# Patient Record
Sex: Female | Born: 2000 | Race: Black or African American | Hispanic: No | Marital: Single | Smoking: Never smoker
Health system: Southern US, Community
[De-identification: ages and names within clinical notes are randomized; demographics above are authoritative.]

---
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
## 2020-01-01 ENCOUNTER — Encounter: Payer: Self-pay | Admitting: Physician Assistant

## 2020-01-01 ENCOUNTER — Other Ambulatory Visit: Payer: Self-pay

## 2020-01-01 ENCOUNTER — Ambulatory Visit: Payer: Medicaid Other | Admitting: Family Medicine

## 2020-01-01 ENCOUNTER — Ambulatory Visit: Payer: Medicaid Other

## 2020-01-01 DIAGNOSIS — Z113 Encounter for screening for infections with a predominantly sexual mode of transmission: Secondary | ICD-10-CM | POA: Diagnosis not present

## 2020-01-01 LAB — WET PREP FOR TRICH, YEAST, CLUE
Trichomonas Exam: NEGATIVE
Yeast Exam: NEGATIVE

## 2020-01-01 NOTE — Progress Notes (Signed)
Patient here for STD testing.Kerry Faxon Brewer-Jensen, RN 

## 2020-01-01 NOTE — Progress Notes (Signed)
[facility] Department STI clinic/screening visit  Subjective:  Kerry Torres is a 19 y.o. female being seen today for an STI screening visit. The patient reports they do not have symptoms.  Patient reports that they do not desire a pregnancy in the next year.   They reported they are not interested in discussing contraception today.  Patient's last menstrual period was 12/14/2019 (approximate).   Patient has the following medical conditions:  There are no problems to display for this patient.   Chief Complaint  Patient presents with  . Exposure to STD    screening     HPI  Patient reports just her for a check up, denies any signs and symptoms.  Reports that Acyclovir  is working well for her with HSV out breaks last out break was 09/2019.   Last HIV test per patient/review of record was 12/2018 No previous PAP d/t patient age.    See flowsheet for further details and programmatic requirements.    The following portions of the patient's history were reviewed and updated as appropriate: allergies, current medications, past medical history, past social history, past surgical history and problem list.  Objective:  There were no vitals filed for this visit.  Physical Exam Constitutional:      Appearance: Normal appearance.  HENT:     Head: Normocephalic.     Comments: In scalp, brows and lashes: no nits, no hair loss    Mouth/Throat:     Mouth: Mucous membranes are moist.     Pharynx: Oropharynx is clear. No oropharyngeal exudate or posterior oropharyngeal erythema.  Abdominal:     General: Abdomen is flat.     Palpations: Abdomen is soft. There is no mass.     Tenderness: There is no abdominal tenderness. There is no guarding or rebound.  Genitourinary:    Comments: External genitalia without, lice, nits, erythema, edema , lesions or inguinal adenopathy. Vagina with normal mucosa and discharge and pH equals 4.  Cervix without visual lesions, uterus firm,  mobile, non-tender, no masses, CMT adnexal fullness or tenderness.  Musculoskeletal:     Cervical back: Normal range of motion and neck supple.  Lymphadenopathy:     Cervical: No cervical adenopathy.  Skin:    General: Skin is warm and dry.     Findings: No bruising, erythema, lesion or rash.  Neurological:     Mental Status: She is alert.  Psychiatric:        Mood and Affect: Mood normal.        Behavior: Behavior normal.      Assessment and Plan:  Kerry Torres is a 19 y.o. female presenting to the [facility] Department for STI screening  1. Screening examination for venereal disease  - Chlamydia/Gonorrhea [place] [facility] - Syphilis Serology, [place] [facility] - HIV  LAB - WET PREP FOR TRICH, YEAST, CLUE  Patient accepted all screenings including  vaginal CT/GC, wet prep and bloodwork for HIV/RPR.  Patient meets criteria for HepB screening? No. Ordered? No - does not meet critera  Patient meets criteria for HepC screening? No. Ordered? No - does not meet critera   Per wet prep no treatment need.  Patient denies s/sx  Discussed time line for [facility] results and that patient will be called with positive results and encouraged patient to call if she had not heard in 2 weeks.  Counseled to return or seek care for continued or worsening symptoms Recommended condom use with  all sex  Patient is currently using condoms to prevent pregnancy.  Discussed with patient about planning pregnancies.  Patient does not want any BCM d/t family and friends negative experiences with BC.  BC information to be given.    Discussed with patient about MVI w/ folic and immunizations.     No follow-ups on file.  No future appointments.  Patient ot call if any questions or concerns.     Wendi Snipes, FNP

## 2020-01-02 LAB — HM HIV SCREENING LAB: HM HIV Screening: NEGATIVE

## 2020-03-31 ENCOUNTER — Telehealth: Payer: Self-pay | Admitting: Family Medicine

## 2020-03-31 NOTE — Telephone Encounter (Signed)
Call from patient. Needs acyclovir refill, last is was 01/2019. Patient scheduled for IS on 04/05/2020.   Harvie Heck, RN

## 2020-03-31 NOTE — Telephone Encounter (Signed)
Patient would like to speak with the provider that wrote her the prescription on her last visit.

## 2020-03-31 NOTE — Telephone Encounter (Signed)
Call to patient, no answer, unable to leave message.   Harvie Heck, RN

## 2020-04-05 ENCOUNTER — Ambulatory Visit: Payer: Medicaid Other | Admitting: Physician Assistant

## 2020-04-05 ENCOUNTER — Encounter: Payer: Self-pay | Admitting: Physician Assistant

## 2020-04-05 ENCOUNTER — Other Ambulatory Visit: Payer: Self-pay

## 2020-04-05 DIAGNOSIS — B009 Herpesviral infection, unspecified: Secondary | ICD-10-CM | POA: Insufficient documentation

## 2020-04-05 DIAGNOSIS — Z113 Encounter for screening for infections with a predominantly sexual mode of transmission: Secondary | ICD-10-CM | POA: Diagnosis not present

## 2020-04-05 MED ORDER — ACYCLOVIR 800 MG PO TABS: 800.0000 mg | ORAL_TABLET | Freq: Every day | ORAL | 11 refills | Status: AC

## 2020-04-05 NOTE — Progress Notes (Signed)
Patient here for rx refill on acyclovir. Declined testing.   Harvie Heck, RN

## 2020-04-05 NOTE — Progress Notes (Signed)
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  States that she had been using the treatment episodically, but would like to change to taking the medicine daily.  Denies changes to personal and surgical history since last visit.  Denies any new allergies and current medications. O:  WDWN female in NAD, A&O x 3, normal work of breathing. A/P:  1.  Patient with history of HSV-2. 2.  Patient declines screening for any infection today.  3.  Per patient request, Acyclovir 800 mg #30 1 po daily with refills for 1 year sent electronically to patient's pharmacy of choice. 4.  Enc condoms with all sex. 5.  RTC prn.

## 2020-06-30 ENCOUNTER — Other Ambulatory Visit: Payer: Self-pay | Admitting: Physician Assistant

## 2021-05-05 ENCOUNTER — Other Ambulatory Visit: Payer: Self-pay | Admitting: Physician Assistant

## 2021-05-05 DIAGNOSIS — B009 Herpesviral infection, unspecified: Secondary | ICD-10-CM

## 2021-05-09 NOTE — Telephone Encounter (Signed)
I need my meds called in to the pharmacy

## 2021-05-10 ENCOUNTER — Other Ambulatory Visit: Payer: Self-pay | Admitting: Family Medicine

## 2021-05-10 DIAGNOSIS — B009 Herpesviral infection, unspecified: Secondary | ICD-10-CM

## 2021-05-10 MED ORDER — ACYCLOVIR 800 MG PO TABS: 800.0000 mg | ORAL_TABLET | Freq: Two times a day (BID) | ORAL | 0 refills | Status: AC

## 2021-05-10 NOTE — Telephone Encounter (Signed)
Phone call with pt regarding refill request. Rx is being sent by provider for 1x episodic tx.  Pt scheduled for 05/17/21 STD appt and provider will address 1 year of refills of suppressive therapy at that visit.

## 2021-05-10 NOTE — Progress Notes (Signed)
Pt called and needs refill on medication for HSV,  she is currently having a outbreak.  Ordered a 1 time episodic treatment for  acyclovir  and patient to be scheduled for annual appointment with provider.    1. HSV-2 infection  - acyclovir (ZOVIRAX) 800 MG tablet; Take 1 tablet (800 mg total) by mouth 2 (two) times daily for 5 days.  Dispense: 10 tablet; Refill: 0    Wendi Snipes, FNP

## 2021-05-17 ENCOUNTER — Ambulatory Visit: Payer: Medicaid Other

## 2021-05-20 ENCOUNTER — Ambulatory Visit: Payer: Medicaid Other

## 2021-05-22 ENCOUNTER — Encounter (HOSPITAL_COMMUNITY): Payer: Self-pay

## 2021-05-22 ENCOUNTER — Emergency Department (HOSPITAL_COMMUNITY): Payer: Medicaid Other

## 2021-05-22 ENCOUNTER — Emergency Department (HOSPITAL_COMMUNITY)
Admission: EM | Admit: 2021-05-22 | Discharge: 2021-05-22 | Disposition: A | Discharge status: Home / Self Care | Payer: Medicaid Other | Attending: Emergency Medicine | Admitting: Emergency Medicine

## 2021-05-22 DIAGNOSIS — T1490XA Injury, unspecified, initial encounter: Secondary | ICD-10-CM

## 2021-05-22 DIAGNOSIS — W3400XA Accidental discharge from unspecified firearms or gun, initial encounter: Secondary | ICD-10-CM | POA: Insufficient documentation

## 2021-05-22 DIAGNOSIS — Z23 Encounter for immunization: Secondary | ICD-10-CM | POA: Diagnosis not present

## 2021-05-22 DIAGNOSIS — S79921A Unspecified injury of right thigh, initial encounter: Secondary | ICD-10-CM | POA: Diagnosis present

## 2021-05-22 DIAGNOSIS — Y92410 Unspecified street and highway as the place of occurrence of the external cause: Secondary | ICD-10-CM | POA: Insufficient documentation

## 2021-05-22 DIAGNOSIS — S71101A Unspecified open wound, right thigh, initial encounter: Secondary | ICD-10-CM | POA: Diagnosis not present

## 2021-05-22 MED ORDER — ACETAMINOPHEN 500 MG PO TABS
1000.0000 mg | ORAL_TABLET | Freq: Once | ORAL | Status: AC
  Administered 2021-05-22: 1000 mg via ORAL
  Filled 2021-05-22: qty 2

## 2021-05-22 MED ORDER — LACTATED RINGERS IV BOLUS
1000.0000 mL | Freq: Once | INTRAVENOUS | Status: AC
  Administered 2021-05-22: 1000 mL via INTRAVENOUS

## 2021-05-22 MED ORDER — TETANUS-DIPHTH-ACELL PERTUSSIS 5-2.5-18.5 LF-MCG/0.5 IM SUSY
0.5000 mL | PREFILLED_SYRINGE | Freq: Once | INTRAMUSCULAR | Status: AC
  Administered 2021-05-22: 0.5 mL via INTRAMUSCULAR
  Filled 2021-05-22: qty 0.5

## 2021-05-22 MED ORDER — ARTIFICIAL TEARS OPHTHALMIC OINT
1.0000 | TOPICAL_OINTMENT | Freq: Once | OPHTHALMIC | Status: AC
Start: 2021-05-22 — End: 2021-05-22
  Administered 2021-05-22: 1 via OPHTHALMIC
  Filled 2021-05-22: qty 3.5

## 2021-05-22 NOTE — Progress Notes (Signed)
Orthopedic Tech Progress Note ?Patient Details:  ?Aubery Lapping ?06-14-2000 ?[id number] ? ?Patient ID: PEGEEN STIGER, female   DOB: December 28, 2000, 21 y.o.   MRN: [id number] ?I attended trauma page. ?Trinna Post ?05/22/2021, 4:23 AM ? ?
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
## 2021-05-25 ENCOUNTER — Ambulatory Visit: Payer: Medicaid Other | Admitting: Nurse Practitioner

## 2021-05-25 ENCOUNTER — Other Ambulatory Visit: Payer: Self-pay

## 2021-05-25 DIAGNOSIS — B009 Herpesviral infection, unspecified: Secondary | ICD-10-CM

## 2021-05-25 DIAGNOSIS — Z113 Encounter for screening for infections with a predominantly sexual mode of transmission: Secondary | ICD-10-CM

## 2021-05-25 MED ORDER — ACYCLOVIR 800 MG PO TABS: 800.0000 mg | ORAL_TABLET | Freq: Every day | ORAL | 11 refills | Status: AC

## 2021-05-25 NOTE — Progress Notes (Signed)
Pt here for refill on her Acyclovir.  Berdie Ogren, RN ? ?

## 2021-05-26 ENCOUNTER — Encounter: Payer: Self-pay | Admitting: Nurse Practitioner

## 2021-05-26 NOTE — Progress Notes (Signed)
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  States that she had been using the treatment continuously.  Denies changes to personal and surgical history since last visit.  Denies any new allergies and current medications. ?O:  A&O x 3, no signs of distress.  ?A: History of HSV ?P:  -Patient declines screening for any infection today.  ?     -Per patient request, Acyclovir 800 mg #30 1 po daily with refills for 1 year    sent electronically to patient's pharmacy of choice. ?    -Recommend condoms with all sex.  ?    -RTC prn. Glenna Fellows, FNP  ?

## 2022-06-23 ENCOUNTER — Other Ambulatory Visit: Payer: Self-pay | Admitting: Nurse Practitioner

## 2022-06-23 DIAGNOSIS — B009 Herpesviral infection, unspecified: Secondary | ICD-10-CM

## 2022-07-03 ENCOUNTER — Other Ambulatory Visit: Payer: Self-pay | Admitting: Nurse Practitioner

## 2022-07-03 DIAGNOSIS — B009 Herpesviral infection, unspecified: Secondary | ICD-10-CM
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
## 2024-05-15 ENCOUNTER — Encounter: Payer: Self-pay | Admitting: Physician Assistant
